# Patient Record
Sex: Male | Born: 1965 | Race: White | Hispanic: No | Marital: Married | Smoking: Never smoker
Health system: Southern US, Community
[De-identification: ages and names within clinical notes are randomized; demographics above are authoritative.]

## PROBLEM LIST (undated history)

## (undated) DIAGNOSIS — G4730 Sleep apnea, unspecified: Secondary | ICD-10-CM

## (undated) DIAGNOSIS — M1990 Unspecified osteoarthritis, unspecified site: Secondary | ICD-10-CM

## (undated) DIAGNOSIS — F329 Major depressive disorder, single episode, unspecified: Secondary | ICD-10-CM

## (undated) DIAGNOSIS — T148XXA Other injury of unspecified body region, initial encounter: Secondary | ICD-10-CM

## (undated) DIAGNOSIS — G4700 Insomnia, unspecified: Secondary | ICD-10-CM

## (undated) DIAGNOSIS — K219 Gastro-esophageal reflux disease without esophagitis: Secondary | ICD-10-CM

## (undated) DIAGNOSIS — F32A Depression, unspecified: Secondary | ICD-10-CM

## (undated) DIAGNOSIS — M109 Gout, unspecified: Secondary | ICD-10-CM

## (undated) DIAGNOSIS — E785 Hyperlipidemia, unspecified: Secondary | ICD-10-CM

## (undated) HISTORY — PX: EXPLORATORY LAPAROTOMY: SUR591

## (undated) HISTORY — PX: WISDOM TOOTH EXTRACTION: SHX21

---

## 2001-06-15 HISTORY — PX: CHOLECYSTECTOMY: SHX55

## 2012-06-15 HISTORY — PX: ROTATOR CUFF REPAIR: SHX139

## 2012-10-04 ENCOUNTER — Encounter (HOSPITAL_COMMUNITY): Payer: Self-pay | Admitting: *Deleted

## 2012-10-04 NOTE — Progress Notes (Signed)
No labs needed

## 2012-10-05 NOTE — H&P (Signed)
  Date of examination:  09-22-12  Indication for surgery: Exotropia preventing binocularity  Pertinent past medical history:  Past Medical History  Diagnosis Date  . Hyperlipemia   . Depression   . Insomnia   . GERD (gastroesophageal reflux disease)     hx-no meds  . Torn muscle     rt calf-wrapped  . Arthritis     knees    Pertinent ocular history:  Hx XT "all my life"; hx patching for amblyopia  Pertinent family history: History reviewed. No pertinent family history.  General:  Healthy appearing patient in no distress.    Eyes:    Acuity cc  [place]  OD 20/20  OS 20/50  External: Within normal limits     Motility LXT = 50, LHT = 4, LXT' = 50, obliques nl  Fundus: Normal     Refraction:   negligible OU   Heart: Regular rate and rhythm without murmur     Lungs: Clear to auscultation     Abdomen: Soft, nontender, normal bowel sounds     Impression:Exotropia, with amblyopia of left eye  Plan: LLR recess and LMR resect  Rushi Chasen O

## 2012-10-07 ENCOUNTER — Encounter (HOSPITAL_BASED_OUTPATIENT_CLINIC_OR_DEPARTMENT_OTHER): Payer: Self-pay | Admitting: *Deleted

## 2012-10-07 ENCOUNTER — Ambulatory Visit (HOSPITAL_BASED_OUTPATIENT_CLINIC_OR_DEPARTMENT_OTHER): Payer: BC Managed Care – PPO | Admitting: *Deleted

## 2012-10-07 ENCOUNTER — Ambulatory Visit (HOSPITAL_BASED_OUTPATIENT_CLINIC_OR_DEPARTMENT_OTHER)
Admission: RE | Admit: 2012-10-07 | Discharge: 2012-10-07 | Disposition: A | Discharge status: Home / Self Care | Payer: BC Managed Care – PPO | Source: Ambulatory Visit | Attending: Ophthalmology | Admitting: Ophthalmology

## 2012-10-07 ENCOUNTER — Encounter (HOSPITAL_BASED_OUTPATIENT_CLINIC_OR_DEPARTMENT_OTHER)
Admission: RE | Disposition: A | Discharge status: Home / Self Care | Payer: Self-pay | Source: Ambulatory Visit | Attending: Ophthalmology

## 2012-10-07 DIAGNOSIS — F3289 Other specified depressive episodes: Secondary | ICD-10-CM | POA: Insufficient documentation

## 2012-10-07 DIAGNOSIS — G4730 Sleep apnea, unspecified: Secondary | ICD-10-CM | POA: Insufficient documentation

## 2012-10-07 DIAGNOSIS — H5010 Unspecified exotropia: Secondary | ICD-10-CM | POA: Insufficient documentation

## 2012-10-07 DIAGNOSIS — H53009 Unspecified amblyopia, unspecified eye: Secondary | ICD-10-CM | POA: Insufficient documentation

## 2012-10-07 DIAGNOSIS — K219 Gastro-esophageal reflux disease without esophagitis: Secondary | ICD-10-CM | POA: Insufficient documentation

## 2012-10-07 DIAGNOSIS — F329 Major depressive disorder, single episode, unspecified: Secondary | ICD-10-CM | POA: Insufficient documentation

## 2012-10-07 DIAGNOSIS — E785 Hyperlipidemia, unspecified: Secondary | ICD-10-CM | POA: Insufficient documentation

## 2012-10-07 HISTORY — DX: Major depressive disorder, single episode, unspecified: F32.9

## 2012-10-07 HISTORY — PX: STRABISMUS SURGERY: SHX218

## 2012-10-07 HISTORY — DX: Other injury of unspecified body region, initial encounter: T14.8XXA

## 2012-10-07 HISTORY — DX: Unspecified osteoarthritis, unspecified site: M19.90

## 2012-10-07 HISTORY — DX: Insomnia, unspecified: G47.00

## 2012-10-07 HISTORY — DX: Gastro-esophageal reflux disease without esophagitis: K21.9

## 2012-10-07 HISTORY — DX: Hyperlipidemia, unspecified: E78.5

## 2012-10-07 HISTORY — DX: Depression, unspecified: F32.A

## 2012-10-07 SURGERY — REPAIR STRABISMUS
Anesthesia: General | Site: Eye | Laterality: Left | Wound class: Clean

## 2012-10-07 MED ORDER — TOBRAMYCIN-DEXAMETHASONE 0.3-0.1 % OP OINT: TOPICAL_OINTMENT | Freq: Two times a day (BID) | OPHTHALMIC | Status: DC

## 2012-10-07 MED ORDER — FENTANYL CITRATE 0.05 MG/ML IJ SOLN: 50.0000 ug | INTRAMUSCULAR | Status: DC | PRN

## 2012-10-07 MED ORDER — MIDAZOLAM HCL 5 MG/5ML IJ SOLN
INTRAMUSCULAR | Status: DC | PRN
  Administered 2012-10-07: 2 mg via INTRAVENOUS

## 2012-10-07 MED ORDER — HYDROMORPHONE HCL PF 1 MG/ML IJ SOLN
0.2500 mg | INTRAMUSCULAR | Status: DC | PRN
  Administered 2012-10-07 (×2): 0.5 mg via INTRAVENOUS

## 2012-10-07 MED ORDER — OXYCODONE HCL 5 MG/5ML PO SOLN
5.0000 mg | Freq: Once | ORAL | Status: AC | PRN
Start: 2012-10-07 — End: 2012-10-07

## 2012-10-07 MED ORDER — PROPOFOL 10 MG/ML IV BOLUS
INTRAVENOUS | Status: DC | PRN
  Administered 2012-10-07: 300 mg via INTRAVENOUS
  Administered 2012-10-07: 100 mg via INTRAVENOUS

## 2012-10-07 MED ORDER — DEXAMETHASONE SODIUM PHOSPHATE 4 MG/ML IJ SOLN
INTRAMUSCULAR | Status: DC | PRN
  Administered 2012-10-07: 10 mg via INTRAVENOUS

## 2012-10-07 MED ORDER — PHENYLEPHRINE HCL 2.5 % OP SOLN
OPHTHALMIC | Status: DC | PRN
  Administered 2012-10-07: 1 [drp] via OPHTHALMIC

## 2012-10-07 MED ORDER — ATROPINE SULFATE 0.4 MG/ML IJ SOLN
INTRAMUSCULAR | Status: DC | PRN
  Administered 2012-10-07: .2 mg via INTRAVENOUS

## 2012-10-07 MED ORDER — BSS IO SOLN
INTRAOCULAR | Status: DC | PRN
  Administered 2012-10-07: 10 mL via INTRAOCULAR

## 2012-10-07 MED ORDER — MIDAZOLAM HCL 2 MG/2ML IJ SOLN: 1.0000 mg | INTRAMUSCULAR | Status: DC | PRN

## 2012-10-07 MED ORDER — KETOROLAC TROMETHAMINE 30 MG/ML IJ SOLN
INTRAMUSCULAR | Status: DC | PRN
  Administered 2012-10-07: 30 mg via INTRAVENOUS

## 2012-10-07 MED ORDER — LIDOCAINE HCL (CARDIAC) 20 MG/ML IV SOLN
INTRAVENOUS | Status: DC | PRN
  Administered 2012-10-07: 60 mg via INTRAVENOUS

## 2012-10-07 MED ORDER — ONDANSETRON HCL 4 MG/2ML IJ SOLN: 4.0000 mg | Freq: Once | INTRAMUSCULAR | Status: DC | PRN

## 2012-10-07 MED ORDER — OXYCODONE HCL 5 MG PO TABS
5.0000 mg | ORAL_TABLET | Freq: Once | ORAL | Status: AC | PRN
  Administered 2012-10-07: 5 mg via ORAL

## 2012-10-07 MED ORDER — FENTANYL CITRATE 0.05 MG/ML IJ SOLN
INTRAMUSCULAR | Status: DC | PRN
  Administered 2012-10-07 (×2): 50 ug via INTRAVENOUS

## 2012-10-07 MED ORDER — LACTATED RINGERS IV SOLN
INTRAVENOUS | Status: DC
  Administered 2012-10-07 (×2): via INTRAVENOUS

## 2012-10-07 MED ORDER — TOBRAMYCIN-DEXAMETHASONE 0.3-0.1 % OP OINT
TOPICAL_OINTMENT | OPHTHALMIC | Status: DC | PRN
  Administered 2012-10-07: 1 via OPHTHALMIC

## 2012-10-07 MED ORDER — ONDANSETRON HCL 4 MG/2ML IJ SOLN
INTRAMUSCULAR | Status: DC | PRN
  Administered 2012-10-07: 4 mg via INTRAVENOUS

## 2012-10-07 SURGICAL SUPPLY — 28 items
APPLICATOR COTTON TIP 6IN STRL (MISCELLANEOUS) ×8 IMPLANT
APPLICATOR DR MATTHEWS STRL (MISCELLANEOUS) ×2 IMPLANT
CAUTERY EYE LOW TEMP 1300F FIN (OPHTHALMIC RELATED) IMPLANT
CLOTH BEACON ORANGE TIMEOUT ST (SAFETY) ×2 IMPLANT
COVER MAYO STAND STRL (DRAPES) ×2 IMPLANT
COVER TABLE BACK 60X90 (DRAPES) ×2 IMPLANT
DRAPE SURG 17X23 STRL (DRAPES) ×4 IMPLANT
DRAPE U-SHAPE 76X120 STRL (DRAPES) ×2 IMPLANT
GLOVE BIO SURGEON STRL SZ 6.5 (GLOVE) ×2 IMPLANT
GLOVE BIOGEL M STRL SZ7.5 (GLOVE) ×4 IMPLANT
GOWN BRE IMP PREV XXLGXLNG (GOWN DISPOSABLE) ×2 IMPLANT
GOWN PREVENTION PLUS XLARGE (GOWN DISPOSABLE) ×2 IMPLANT
NS IRRIG 1000ML POUR BTL (IV SOLUTION) ×2 IMPLANT
PACK BASIN DAY SURGERY FS (CUSTOM PROCEDURE TRAY) ×2 IMPLANT
PAD EYE OVAL STERILE LF (GAUZE/BANDAGES/DRESSINGS) ×4 IMPLANT
SHEET MEDIUM DRAPE 40X70 STRL (DRAPES) IMPLANT
SPEAR EYE SURG WECK-CEL (MISCELLANEOUS) ×4 IMPLANT
STRIP CLOSURE SKIN 1/4X4 (GAUZE/BANDAGES/DRESSINGS) IMPLANT
SUT 6 0 SILK T G140 8DA (SUTURE) IMPLANT
SUT MERSILENE 6 0 S14 DA (SUTURE) IMPLANT
SUT PLAIN 6 0 TG1408 (SUTURE) ×2 IMPLANT
SUT SILK 4 0 C 3 735G (SUTURE) IMPLANT
SUT VICRYL 6 0 S 28 (SUTURE) ×2 IMPLANT
SUT VICRYL ABS 6-0 S29 18IN (SUTURE) ×2 IMPLANT
SYRINGE 10CC LL (SYRINGE) ×2 IMPLANT
TOWEL OR 17X24 6PK STRL BLUE (TOWEL DISPOSABLE) ×2 IMPLANT
TOWEL OR NON WOVEN STRL DISP B (DISPOSABLE) ×2 IMPLANT
TRAY DSU PREP LF (CUSTOM PROCEDURE TRAY) ×2 IMPLANT

## 2012-10-07 NOTE — Anesthesia Preprocedure Evaluation (Signed)
Anesthesia Evaluation  Patient identified by MRN, date of birth, ID band Patient awake    Reviewed: Allergy & Precautions, H&P , NPO status , Patient's Chart, lab work & pertinent test results  Airway Mallampati: I TM Distance: >3 FB Neck ROM: Full    Dental  (+) Teeth Intact and Dental Advisory Given   Pulmonary  breath sounds clear to auscultation        Cardiovascular Rhythm:Regular     Neuro/Psych    GI/Hepatic GERD-  Medicated and Controlled,  Endo/Other    Renal/GU      Musculoskeletal   Abdominal   Peds  Hematology   Anesthesia Other Findings   Reproductive/Obstetrics                           Anesthesia Physical Anesthesia Plan  ASA: I  Anesthesia Plan: General   Post-op Pain Management:    Induction: Intravenous  Airway Management Planned: LMA  Additional Equipment:   Intra-op Plan:   Post-operative Plan: Extubation in OR  Informed Consent: I have reviewed the patients History and Physical, chart, labs and discussed the procedure including the risks, benefits and alternatives for the proposed anesthesia with the patient or authorized representative who has indicated his/her understanding and acceptance.   Dental advisory given  Plan Discussed with: CRNA, Anesthesiologist and Surgeon  Anesthesia Plan Comments:         Anesthesia Quick Evaluation

## 2012-10-07 NOTE — Transfer of Care (Signed)
Immediate Anesthesia Transfer of Care Note  Patient: Aaron Beck  Procedure(s) Performed: Procedure(s): REPAIR STRABISMUS (Left)  Patient Location: PACU  Anesthesia Type:General  Level of Consciousness: awake and responds to stimulation  Airway & Oxygen Therapy: Patient Spontanous Breathing and Patient connected to face mask oxygen  Post-op Assessment: Report given to PACU RN, Post -op Vital signs reviewed and stable and Patient moving all extremities  Post vital signs: Reviewed and stable  Complications: No apparent anesthesia complications

## 2012-10-07 NOTE — Anesthesia Procedure Notes (Addendum)
Procedure Name: LMA Insertion Date/Time: 10/07/2012 10:15 AM Performed by: Meyer Russel Pre-anesthesia Checklist: Patient identified, Emergency Drugs available, Suction available and Patient being monitored Patient Re-evaluated:Patient Re-evaluated prior to inductionOxygen Delivery Method: Circle System Utilized Preoxygenation: Pre-oxygenation with 100% oxygen Intubation Type: IV induction Ventilation: Mask ventilation without difficulty LMA: LMA inserted LMA Size: 5.0 Number of attempts: 2 (unable to advance Flex LMA thru teeth #5 Unique LMA inerted) Airway Equipment and Method: bite block Placement Confirmation: positive ETCO2 and breath sounds checked- equal and bilateral Tube secured with: Tape Dental Injury: Teeth and Oropharynx as per pre-operative assessment    Performed by: Meyer Russel

## 2012-10-07 NOTE — Op Note (Signed)
10/07/2012  11:06 AM  PATIENT:  Aaron Beck    PRE-OPERATIVE DIAGNOSIS:  exotropia  POST-OPERATIVE DIAGNOSIS:  Same  PROCEDURE:  REPAIR STRABISMUS  SURGEON:  Shara Blazing, MD  ANESTHESIA:   General  PREOPERATIVE INDICATIONS:  [name] is a  47 y.o. male with a diagnosis of exotropia who failed conservative measures and elected for surgical management.    The risks benefits and alternatives were discussed with the patient preoperatively including but not limited to the risks of infection, bleeding, nerve injury, cardiopulmonary complications, the need for revision surgery, among others, and the patient was willing to proc  OPERATIVE PROCEDURE: The patient was taken to the operating room where he was identified by me. General anesthesia was induced without difficulty after placement of appropriate monitors. The patient was prepped and draped in standard sterile fashion. A lid speculum was placed in the left eye to  Through an inferotemporal fornix incision through conjunctiva and Tenon fascia, the left lateral rectus muscle was engaged on a series of muscle hooks and cleared of its fascial attachments. The tendon was secured with a double-armed 6-0 Vicryl suture, with a double locking bite at each border of the muscle, 1 mm from the insertion. The muscle was disinserted. It was reattached to sclera at a measured distance of 9.0 mm posterior to the original insertion, using direct scleral passes in crossed swords fashion. The suture ends were tied securely after the position of the muscle had been checked and found to be accurate. Conjunctiva was closed with 2 6-0 Vicryl sutures.  Through an inferonasal fornix incision through conjunctiva and Tenon fascia, the left medial rectus muscle was engaged on a series of muscle hooks and carefully cleared of its fascial attachments to at least 15 mm posterior to the insertion. The muscle was spread between 2 self-retaining hooks. A 2 mm bite was  taken of the center of the muscle belly at a measured distance of 7.0 mm posterior to the insertion, and a knot was tied securely at this location. The needle agent of the double-armed suture was passed from the center of the muscle belly to the periphery, parallel to and 7.0 mm posterior to the insertion. A double locking bite was placed at each border of the muscle. A resection clamp was placed on the muscle just anterior to the sutures. The muscle was disinserted. Each pole suture was passed posteriorly to anteriorly through the corresponding end of the muscle stump, then anteriorly to posteriorly near the center of the stump, then posteriorly to anteriorly through the center of the muscle belly, just posterior to the previously placed knot. The muscle was drawn up to the level of the original insertion, and all slack was removed before the suture ends were tied securely. The clamp was removed. The portion of the muscle anterior to the sutures was carefully excised. Conjunctiva was closed with 2 6-0 Vicryl sutures. The speculum was removed. TobraDex ointment was placed in the left eye. The patient was awakened without difficulty and taken to the recovery room in stable condition, having suffered no intraoperative or immediate postoperative complications.

## 2012-10-07 NOTE — Interval H&P Note (Signed)
History and Physical Interval Note:  10/07/2012 9:53 AM  Aaron Beck  has presented today for surgery, with the diagnosis of exotropia  The various methods of treatment have been discussed with the patient and family. After consideration of risks, benefits and other options for treatment, the patient has consented to  Procedure(s): REPAIR STRABISMUS (Left) as a surgical intervention .  The patient's history has been reviewed, patient examined, no change in status, stable for surgery.  I have reviewed the patient's chart and labs.  Questions were answered to the patient's satisfaction.     Shara Blazing

## 2012-10-07 NOTE — Anesthesia Postprocedure Evaluation (Signed)
  Anesthesia Post-op Note  Patient: Aaron Beck  Procedure(s) Performed: Procedure(s): REPAIR STRABISMUS (Left)  Patient Location: PACU  Anesthesia Type:General  Level of Consciousness: awake, alert  and oriented  Airway and Oxygen Therapy: Patient Spontanous Breathing and Patient connected to face mask oxygen  Post-op Pain: none  Post-op Assessment: Post-op Vital signs reviewed  Post-op Vital Signs: Reviewed  Complications: No apparent anesthesia complications

## 2012-10-07 NOTE — Interval H&P Note (Signed)
History and Physical Interval Note:  10/07/2012 9:53 AM  Aaron Beck  has presented today for surgery, with the diagnosis of exotropia  The various methods of treatment have been discussed with the patient and family. After consideration of risks, benefits and other options for treatment, the patient has consented to  Procedure(s): REPAIR STRABISMUS (Left) as a surgical intervention .  The patient's history has been reviewed, patient examined, no change in status, stable for surgery.  I have reviewed the patient's chart and labs.  Questions were answered to the patient's satisfaction.     [name] O   

## 2012-10-10 ENCOUNTER — Encounter (HOSPITAL_BASED_OUTPATIENT_CLINIC_OR_DEPARTMENT_OTHER): Payer: Self-pay | Admitting: Ophthalmology

## 2015-11-14 ENCOUNTER — Encounter (INDEPENDENT_AMBULATORY_CARE_PROVIDER_SITE_OTHER): Payer: Self-pay | Admitting: *Deleted

## 2015-11-21 ENCOUNTER — Encounter (INDEPENDENT_AMBULATORY_CARE_PROVIDER_SITE_OTHER): Payer: Self-pay | Admitting: *Deleted

## 2015-11-21 ENCOUNTER — Other Ambulatory Visit (INDEPENDENT_AMBULATORY_CARE_PROVIDER_SITE_OTHER): Payer: Self-pay | Admitting: *Deleted

## 2015-11-21 DIAGNOSIS — Z1211 Encounter for screening for malignant neoplasm of colon: Secondary | ICD-10-CM

## 2016-01-10 ENCOUNTER — Telehealth (INDEPENDENT_AMBULATORY_CARE_PROVIDER_SITE_OTHER): Payer: Self-pay | Admitting: *Deleted

## 2016-01-10 ENCOUNTER — Encounter (INDEPENDENT_AMBULATORY_CARE_PROVIDER_SITE_OTHER): Payer: Self-pay | Admitting: *Deleted

## 2016-01-10 NOTE — Telephone Encounter (Signed)
Patient needs trilyte 

## 2016-01-14 MED ORDER — PEG 3350-KCL-NA BICARB-NACL 420 G PO SOLR: 4000.0000 mL | Freq: Once | ORAL | 0 refills | Status: AC

## 2016-02-03 ENCOUNTER — Telehealth (INDEPENDENT_AMBULATORY_CARE_PROVIDER_SITE_OTHER): Payer: Self-pay | Admitting: *Deleted

## 2016-02-03 NOTE — Telephone Encounter (Signed)
Referring MD/PCP: vyas   Procedure: tcs  Reason/Indication:  screening  Has patient had this procedure before?  no  If so, when, by whom and where?    Is there a family history of colon cancer?  no  Who?  What age when diagnosed?    Is patient diabetic?   no      Does patient have prosthetic heart valve or mechanical valve?  no  Do you have a pacemaker?  no  Has patient ever had endocarditis? no  Has patient had joint replacement within last 12 months?  no  Does patient tend to be constipated or take laxatives? no  Does patient have a history of alcohol/drug use?  no  Is patient on Coumadin, Plavix and/or Aspirin? no  Medications: simvastatin 40 mg daily, fenofibrate 54 mg daily, citalopram 15 mg daily, ambien 10 mg nightly  Allergies: nkda  Medication Adjustment:   Procedure date & time: 03/05/16 at 930

## 2016-02-04 NOTE — Telephone Encounter (Signed)
agree

## 2016-05-20 ENCOUNTER — Encounter (INDEPENDENT_AMBULATORY_CARE_PROVIDER_SITE_OTHER): Payer: Self-pay | Admitting: *Deleted

## 2016-05-25 ENCOUNTER — Telehealth (INDEPENDENT_AMBULATORY_CARE_PROVIDER_SITE_OTHER): Payer: Self-pay | Admitting: *Deleted

## 2016-05-25 NOTE — Telephone Encounter (Signed)
Referring MD/PCP: vyas   Procedure: tcs  Reason/Indication:  screening  Has patient had this procedure before?  no  If so, when, by whom and where?    Is there a family history of colon cancer?  no  Who?  What age when diagnosed?    Is patient diabetic?   no      Does patient have prosthetic heart valve or mechanical valve?  no  Do you have a pacemaker?  no  Has patient ever had endocarditis? no  Has patient had joint replacement within last 12 months?  no  Does patient tend to be constipated or take laxatives? no  Does patient have a history of alcohol/drug use?  no  Is patient on Coumadin, Plavix and/or Aspirin? no  Medications: simvastatin 40 mg daily, fenofibrate 54 mg daily, citalopram 15 mg daily, ambien 10 mg nightly  Allergies: nkda  Medication Adjustment:   Procedure date & time: 06/25/16 at 730

## 2016-05-26 NOTE — Telephone Encounter (Signed)
agree

## 2016-06-22 ENCOUNTER — Encounter (INDEPENDENT_AMBULATORY_CARE_PROVIDER_SITE_OTHER): Payer: Self-pay | Admitting: *Deleted

## 2016-08-14 ENCOUNTER — Encounter (INDEPENDENT_AMBULATORY_CARE_PROVIDER_SITE_OTHER): Payer: Self-pay | Admitting: *Deleted

## 2016-08-14 NOTE — Telephone Encounter (Signed)
This encounter was created in error - please disregard.

## 2016-08-27 ENCOUNTER — Telehealth (INDEPENDENT_AMBULATORY_CARE_PROVIDER_SITE_OTHER): Payer: Self-pay | Admitting: *Deleted

## 2016-08-27 NOTE — Telephone Encounter (Signed)
agree

## 2016-08-27 NOTE — Telephone Encounter (Signed)
Referring MD/PCP: vyas   Procedure: tcs  Reason/Indication:  screening  Has patient had this procedure before?  no             If so, when, by whom and where?    Is there a family history of colon cancer?  no             Who?  What age when diagnosed?    Is patient diabetic?   no                                                  Does patient have prosthetic heart valve or mechanical valve?  no  Do you have a pacemaker?  no  Has patient ever had endocarditis? no  Has patient had joint replacement within last 12 months?  no  Does patient tend to be constipated or take laxatives? no  Does patient have a history of alcohol/drug use?  no  Is patient on Coumadin, Plavix and/or Aspirin? no  Medications: simvastatin 40 mg daily, fenofibrate 54 mg daily, citalopram 15 mg daily, ambien 10 mg nightly  Allergies: nkda  Medication Adjustment:   Procedure date & time: 09/23/16 at 930

## 2016-09-23 ENCOUNTER — Encounter (HOSPITAL_COMMUNITY): Payer: Self-pay | Admitting: *Deleted

## 2016-09-23 ENCOUNTER — Encounter (HOSPITAL_COMMUNITY)
Admission: RE | Disposition: A | Discharge status: Home / Self Care | Payer: Self-pay | Source: Ambulatory Visit | Attending: Internal Medicine

## 2016-09-23 ENCOUNTER — Ambulatory Visit (HOSPITAL_COMMUNITY)
Admission: RE | Admit: 2016-09-23 | Discharge: 2016-09-23 | Disposition: A | Discharge status: Home / Self Care | Payer: BC Managed Care – PPO | Source: Ambulatory Visit | Attending: Internal Medicine | Admitting: Internal Medicine

## 2016-09-23 DIAGNOSIS — Z79899 Other long term (current) drug therapy: Secondary | ICD-10-CM | POA: Diagnosis not present

## 2016-09-23 DIAGNOSIS — K648 Other hemorrhoids: Secondary | ICD-10-CM

## 2016-09-23 DIAGNOSIS — Z9049 Acquired absence of other specified parts of digestive tract: Secondary | ICD-10-CM | POA: Diagnosis not present

## 2016-09-23 DIAGNOSIS — Z1211 Encounter for screening for malignant neoplasm of colon: Secondary | ICD-10-CM | POA: Diagnosis not present

## 2016-09-23 DIAGNOSIS — G4700 Insomnia, unspecified: Secondary | ICD-10-CM | POA: Diagnosis not present

## 2016-09-23 DIAGNOSIS — F329 Major depressive disorder, single episode, unspecified: Secondary | ICD-10-CM | POA: Insufficient documentation

## 2016-09-23 DIAGNOSIS — E785 Hyperlipidemia, unspecified: Secondary | ICD-10-CM | POA: Diagnosis not present

## 2016-09-23 DIAGNOSIS — G4730 Sleep apnea, unspecified: Secondary | ICD-10-CM | POA: Insufficient documentation

## 2016-09-23 HISTORY — DX: Sleep apnea, unspecified: G47.30

## 2016-09-23 HISTORY — PX: COLONOSCOPY: SHX5424

## 2016-09-23 SURGERY — COLONOSCOPY
Anesthesia: Moderate Sedation

## 2016-09-23 MED ORDER — MIDAZOLAM HCL 5 MG/5ML IJ SOLN
INTRAMUSCULAR | Status: DC
Start: 2016-09-23 — End: 2016-09-23
  Filled 2016-09-23: qty 10

## 2016-09-23 MED ORDER — STERILE WATER FOR IRRIGATION IR SOLN
Status: DC | PRN
  Administered 2016-09-23: 2.5 mL

## 2016-09-23 MED ORDER — SODIUM CHLORIDE 0.9 % IV SOLN
INTRAVENOUS | Status: DC
  Administered 2016-09-23: 1000 mL via INTRAVENOUS

## 2016-09-23 MED ORDER — MEPERIDINE HCL 50 MG/ML IJ SOLN
INTRAMUSCULAR | Status: AC
  Filled 2016-09-23: qty 1

## 2016-09-23 MED ORDER — MEPERIDINE HCL 50 MG/ML IJ SOLN
INTRAMUSCULAR | Status: DC | PRN
  Administered 2016-09-23 (×2): 25 mg via INTRAVENOUS

## 2016-09-23 MED ORDER — MIDAZOLAM HCL 5 MG/5ML IJ SOLN
INTRAMUSCULAR | Status: DC | PRN
  Administered 2016-09-23: 3 mg via INTRAVENOUS
  Administered 2016-09-23: 2 mg via INTRAVENOUS
  Administered 2016-09-23: 3 mg via INTRAVENOUS
  Administered 2016-09-23: 2 mg via INTRAVENOUS

## 2016-09-23 NOTE — Op Note (Signed)
[facility] Patient Name: Aaron Beck Procedure Date: 09/23/2016 9:39 AM MRN: [id number] Date of Birth: 05/14/1966 Attending MD: Lionel December , MD CSN: [id number] Age: 51 Admit Type: Outpatient Procedure:                Colonoscopy Indications:              Screening for colorectal malignant neoplasm Providers:                Lionel December, MD, Toniann Fail RN, RN, Laurena Bering, Technologist Referring MD:             Ignatius Specking, MD Medicines:                Meperidine 50 mg IV, Midazolam 10 mg IV Complications:            No immediate complications. Estimated Blood Loss:     Estimated blood loss: none. Procedure:                Pre-Anesthesia Assessment:                           - Prior to the procedure, a History and Physical                            was performed, and patient medications and                            allergies were reviewed. The patient's tolerance of                            previous anesthesia was also reviewed. The risks                            and benefits of the procedure and the sedation                            options and risks were discussed with the patient.                            All questions were answered, and informed consent                            was obtained. Prior Anticoagulants: The patient has                            taken no previous anticoagulant or antiplatelet                            agents. ASA Grade Assessment: II - A patient with                            mild systemic disease. After reviewing the risks  and benefits, the patient was deemed in                            satisfactory condition to undergo the procedure.                           After obtaining informed consent, the colonoscope                            was passed under direct vision. Throughout the                            procedure, the patient's blood pressure, pulse, and                         oxygen saturations were monitored continuously. The                            EC-[id number] ([id number]) scope was introduced through                            the anus and advanced to the the cecum, identified                            by appendiceal orifice and ileocecal valve. The                            colonoscopy was performed without difficulty. The                            patient tolerated the procedure well. The quality                            of the bowel preparation was excellent. The                            ileocecal valve, appendiceal orifice, and rectum                            were photographed. Scope In: 10:08:23 AM Scope Out: 10:28:10 AM Scope Withdrawal Time: 0 hours 8 minutes 39 seconds  Total Procedure Duration: 0 hours 19 minutes 47 seconds  Findings:      The perianal and digital rectal examinations were normal.      The colon (entire examined portion) appeared normal.      Internal hemorrhoids were found during retroflexion. The hemorrhoids       were small. Impression:               - The entire examined colon is normal.                           - Internal hemorrhoids.                           - No specimens collected. Moderate Sedation:      Moderate (  conscious) sedation was administered by the endoscopy nurse       and supervised by the endoscopist. The following parameters were       monitored: oxygen saturation, heart rate, blood pressure, CO2       capnography and response to care. Total physician intraservice time was       26 minutes. Recommendation:           - Patient has a contact number available for                            emergencies. The signs and symptoms of potential                            delayed complications were discussed with the                            patient. Return to normal activities tomorrow.                            Written discharge instructions were provided to the                             patient.                           - Resume previous diet today.                           - Continue present medications.                           - Repeat colonoscopy in 10 years for screening                            purposes. Procedure Code(s):        --- Professional ---                           [phone], Colonoscopy, flexible; diagnostic, including                            collection of specimen(s) by brushing or washing,                            when performed (separate procedure)                           99152, Moderate sedation services provided by the                            same physician or other qualified health care                            professional performing the diagnostic or  therapeutic service that the sedation supports,                            requiring the presence of an independent trained                            observer to assist in the monitoring of the                            patient's level of consciousness and physiological                            status; initial 15 minutes of intraservice time,                            patient age 19 years or older                           [phone], Moderate sedation services; each additional                            15 minutes intraservice time Diagnosis Code(s):        --- Professional ---                           Z12.11, Encounter for screening for malignant                            neoplasm of colon                           K64.8, Other hemorrhoids CPT copyright 2016 [employer]. All rights reserved. The codes documented in this report are preliminary and upon coder review may  be revised to meet current compliance requirements. Lionel December, MD Lionel December, MD 09/23/2016 10:37:04 AM This report has been signed electronically. Number of Addenda: 0

## 2016-09-23 NOTE — Discharge Instructions (Signed)
Resume usual medications and diet. No driving for 24 hours. Next screening exam in 10 years.    Colonoscopy, Adult, Care After This sheet gives you information about how to care for yourself after your procedure. Your doctor may also give you more specific instructions. If you have problems or questions, call your doctor. Follow these instructions at home: General instructions    For the first 24 hours after the procedure:  Do not drive or use machinery.  Do not sign important documents.  Do not drink alcohol.  Do your daily activities more slowly than normal.  Eat foods that are soft and easy to digest.  Rest often.  Take over-the-counter or prescription medicines only as told by your doctor.  It is up to you to get the results of your procedure. Ask your doctor, or the department performing the procedure, when your results will be ready. To help cramping and bloating:   Try walking around.  Put heat on your belly (abdomen) as told by your doctor. Use a heat source that your doctor recommends, such as a moist heat pack or a heating pad.  Put a towel between your skin and the heat source.  Leave the heat on for 20-30 minutes.  Remove the heat if your skin turns bright red. This is especially important if you cannot feel pain, heat, or cold. You can get burned. Eating and drinking   Drink enough fluid to keep your pee (urine) clear or pale yellow.  Return to your normal diet as told by your doctor. Avoid heavy or fried foods that are hard to digest.  Avoid drinking alcohol for as long as told by your doctor. Contact a doctor if:  You have blood in your poop (stool) 2-3 days after the procedure. Get help right away if:  You have more than a small amount of blood in your poop.  You see large clumps of tissue (blood clots) in your poop.  Your belly is swollen.  You feel sick to your stomach (nauseous).  You throw up (vomit).  You have a fever.  You have  belly pain that gets worse, and medicine does not help your pain.    Fat and Cholesterol Restricted Diet Getting too much fat and cholesterol in your diet may cause health problems. Following this diet helps keep your fat and cholesterol at normal levels. This can keep you from getting sick. What types of fat should I choose?  Choose monosaturated and polyunsaturated fats. These are found in foods such as olive oil, canola oil, flaxseeds, walnuts, almonds, and seeds.  Eat more omega-3 fats. Good choices include salmon, mackerel, sardines, tuna, flaxseed oil, and ground flaxseeds.  Limit saturated fats. These are in animal products such as meats, butter, and cream. They can also be in plant products such as palm oil, palm kernel oil, and coconut oil.  Avoid foods with partially hydrogenated oils in them. These contain trans fats. Examples of foods that have trans fats are stick margarine, some tub margarines, cookies, crackers, and other baked goods. What general guidelines do I need to follow?  Check food labels. Look for the words "trans fat" and "saturated fat."  When preparing a meal:  Fill half of your plate with vegetables and green salads.  Fill one fourth of your plate with whole grains. Look for the word "whole" as the first word in the ingredient list.  Fill one fourth of your plate with lean protein foods.  Eat more foods that  have fiber, like apples, carrots, beans, peas, and barley.  Eat more home-cooked foods. Eat less at restaurants and buffets.  Limit or avoid alcohol.  Limit foods high in starch and sugar.  Limit fried foods.  Cook foods without frying them. Baking, boiling, grilling, and broiling are all great options.  Lose weight if you are overweight. Losing even a small amount of weight can help your overall health. It can also help prevent diseases such as diabetes and heart disease. What foods can I eat? Grains  Whole grains, such as whole wheat or  whole grain breads, crackers, cereals, and pasta. Unsweetened oatmeal, bulgur, barley, quinoa, or brown rice. Corn or whole wheat flour tortillas. Vegetables  Fresh or frozen vegetables (raw, steamed, roasted, or grilled). Green salads. Fruits  All fresh, canned (in natural juice), or frozen fruits. Meat and Other Protein Products  Ground beef (85% or leaner), grass-fed beef, or beef trimmed of fat. Skinless chicken or [place]. Ground chicken or [place]. Pork trimmed of fat. All fish and seafood. Eggs. Dried beans, peas, or lentils. Unsalted nuts or seeds. Unsalted canned or dry beans. Dairy  Low-fat dairy products, such as skim or 1% milk, 2% or reduced-fat cheeses, low-fat ricotta or cottage cheese, or plain low-fat yogurt. Fats and Oils  Tub margarines without trans fats. Light or reduced-fat mayonnaise and salad dressings. Avocado. Olive, canola, sesame, or safflower oils. Natural peanut or almond butter (choose ones without added sugar and oil). The items listed above may not be a complete list of recommended foods or beverages. Contact your dietitian for more options.  What foods are not recommended? Grains  White bread. White pasta. White rice. Cornbread. Bagels, pastries, and croissants. Crackers that contain trans fat. Vegetables  White potatoes. Corn. Creamed or fried vegetables. Vegetables in a cheese sauce. Fruits  Dried fruits. Canned fruit in light or heavy syrup. Fruit juice. Meat and Other Protein Products  Fatty cuts of meat. Ribs, chicken wings, bacon, sausage, bologna, salami, chitterlings, fatback, hot dogs, bratwurst, and packaged luncheon meats. Liver and organ meats. Dairy  Whole or 2% milk, cream, half-and-half, and cream cheese. Whole milk cheeses. Whole-fat or sweetened yogurt. Full-fat cheeses. Nondairy creamers and whipped toppings. Processed cheese, cheese spreads, or cheese curds. Sweets and Desserts  Corn syrup, sugars, honey, and molasses. Candy. Jam and jelly.  Syrup. Sweetened cereals. Cookies, pies, cakes, donuts, muffins, and ice cream. Fats and Oils  Butter, stick margarine, lard, shortening, ghee, or bacon fat. Coconut, palm kernel, or palm oils. Beverages  Alcohol. Sweetened drinks (such as sodas, lemonade, and fruit drinks or punches). The items listed above may not be a complete list of foods and beverages to avoid. Contact your dietitian for more information.  This information is not intended to replace advice given to you by your health care provider. Make sure you discuss any questions you have with your health care provider. Document Released: 12/01/2011 Document Revised: 02/06/2016 Document Reviewed: 08/31/2013 Elsevier Interactive Patient Education  2017 [employer].

## 2016-09-23 NOTE — H&P (Signed)
Aaron Beck is an 51 y.o. male.   Chief Complaint: Patient is here for colonoscopy. HPI: Patient is 51 year old Caucasian male who is here for screening colonoscopy. He denies abdominal pain change in bowel habits or rectal bleeding. Family History is negative for CRC.  Past Medical History:  Diagnosis Date  . Arthritis    knees  . Depression   . GERD (gastroesophageal reflux disease)    hx-no meds  . Hyperlipemia   . Insomnia   . Sleep apnea    uses cpap  . Torn muscle    rt calf-wrapped    Past Surgical History:  Procedure Laterality Date  . CHOLECYSTECTOMY  2003  . EXPLORATORY LAPAROTOMY     at 10 weeks intestinal fluid  . ROTATOR CUFF REPAIR Left 2014   bone spurs, torn labrum, and torn bicep  . STRABISMUS SURGERY Left 10/07/2012   Procedure: REPAIR STRABISMUS;  Surgeon: Shara Blazing, MD;  Location: [facility];  Service: Ophthalmology;  Laterality: Left;  . WISDOM TOOTH EXTRACTION      Family History  Problem Relation Age of Onset  . Bladder Cancer Father   . Fibromyalgia Sister    Social History:  reports that he has never smoked. He has never used smokeless tobacco. He reports that he does not drink alcohol or use drugs.  Allergies: No Known Allergies  Medications Prior to Admission  Medication Sig Dispense Refill  . citalopram (CELEXA) 20 MG tablet Take 10-20 mg by mouth 2 (two) times daily. 20 mg in the morning, 10 mg in the afternoon    . colchicine 0.6 MG tablet Take 0.6 mg by mouth daily.    . fenofibrate 54 MG tablet Take 54 mg by mouth daily.    . simvastatin (ZOCOR) 40 MG tablet Take 40 mg by mouth every morning.    . zolpidem (AMBIEN) 10 MG tablet Take 10 mg by mouth at bedtime.      No results found for this or any previous visit (from the past 48 hour(s)). No results found.  ROS  Blood pressure 129/83, pulse 77, temperature 98.2 F (36.8 C), temperature source Oral, resp. rate 16, height  (1.803 m), weight 234 lb (106.1  kg), SpO2 97 %. Physical Exam  Constitutional: He appears well-developed and well-nourished.  HENT:  Mouth/Throat: Oropharynx is clear and moist.  Eyes: Conjunctivae are normal. No scleral icterus.  Neck: No thyromegaly present.  Cardiovascular: Normal rate, regular rhythm and normal heart sounds.   No murmur heard. Respiratory: Effort normal and breath sounds normal.  GI: Soft. He exhibits no distension and no mass. There is no tenderness.  Musculoskeletal: He exhibits no edema.  Lymphadenopathy:    He has no cervical adenopathy.  Neurological: He is alert.  Skin: Skin is warm and dry.     Assessment/Plan Average risk screening colonoscopy.  Lionel December, MD 09/23/2016, 9:59 AM

## 2016-09-25 ENCOUNTER — Encounter (HOSPITAL_COMMUNITY): Payer: Self-pay | Admitting: Internal Medicine

## 2019-01-21 ENCOUNTER — Emergency Department (HOSPITAL_COMMUNITY): Payer: BC Managed Care – PPO

## 2019-01-21 ENCOUNTER — Other Ambulatory Visit: Payer: Self-pay

## 2019-01-21 ENCOUNTER — Emergency Department (HOSPITAL_COMMUNITY)
Admission: EM | Admit: 2019-01-21 | Discharge: 2019-01-22 | Disposition: A | Discharge status: Home / Self Care | Payer: BC Managed Care – PPO | Attending: Emergency Medicine | Admitting: Emergency Medicine

## 2019-01-21 ENCOUNTER — Encounter (HOSPITAL_COMMUNITY): Payer: Self-pay

## 2019-01-21 DIAGNOSIS — Y92009 Unspecified place in unspecified non-institutional (private) residence as the place of occurrence of the external cause: Secondary | ICD-10-CM | POA: Insufficient documentation

## 2019-01-21 DIAGNOSIS — Y9389 Activity, other specified: Secondary | ICD-10-CM | POA: Insufficient documentation

## 2019-01-21 DIAGNOSIS — X500XXA Overexertion from strenuous movement or load, initial encounter: Secondary | ICD-10-CM | POA: Diagnosis not present

## 2019-01-21 DIAGNOSIS — S20221A Contusion of right back wall of thorax, initial encounter: Secondary | ICD-10-CM | POA: Diagnosis not present

## 2019-01-21 DIAGNOSIS — M25511 Pain in right shoulder: Secondary | ICD-10-CM | POA: Insufficient documentation

## 2019-01-21 DIAGNOSIS — T148XXA Other injury of unspecified body region, initial encounter: Secondary | ICD-10-CM

## 2019-01-21 DIAGNOSIS — Z79899 Other long term (current) drug therapy: Secondary | ICD-10-CM | POA: Diagnosis not present

## 2019-01-21 DIAGNOSIS — Y999 Unspecified external cause status: Secondary | ICD-10-CM | POA: Diagnosis not present

## 2019-01-21 DIAGNOSIS — W19XXXA Unspecified fall, initial encounter: Secondary | ICD-10-CM

## 2019-01-21 HISTORY — DX: Gout, unspecified: M10.9

## 2019-01-21 MED ORDER — HYDROCODONE-ACETAMINOPHEN 5-325 MG PO TABS: 2.0000 | ORAL_TABLET | ORAL | 0 refills | Status: AC | PRN

## 2019-01-21 MED ORDER — IBUPROFEN 600 MG PO TABS: 600.0000 mg | ORAL_TABLET | Freq: Four times a day (QID) | ORAL | 0 refills | Status: AC | PRN

## 2019-01-21 MED ORDER — BACITRACIN-NEOMYCIN-POLYMYXIN 400-5-5000 EX OINT
TOPICAL_OINTMENT | Freq: Once | CUTANEOUS | Status: AC
  Administered 2019-01-21: 1 via TOPICAL
  Filled 2019-01-21: qty 1

## 2019-01-21 NOTE — ED Notes (Signed)
ED Provider at bedside. 

## 2019-01-21 NOTE — ED Triage Notes (Signed)
Pt presents to ED following fall down approx 12-15 steps carrying dresser, states dresser fell on him. Pt denies LOC or hitting head. Pt states he landed on right side. Pt c/o right shoulder pain, left elbow, left leg. Pt also with laceration to left leg, bleeding controlled at this time.

## 2019-01-21 NOTE — Discharge Instructions (Addendum)
Your xrays are negative for acute fractures as discussed.  Continue using ice as much as is comfortable for the next 2 days.  You may switch to a heating pad on day #3 applied for 20 minutes several times daily which can help speed injury recovery.  I recommend ibuprofen for pain and inflammation.  Wear the sling for comfort and to take the weight off of your shoulder joint.  You may need further test to assess for soft tissue injury such as ligamentous or tendon injury.  Call Dr. Archie Endo office for further evaluation if your symptoms persist or not improving over the next 7 to 10 days with this treatment plan.

## 2019-01-22 NOTE — ED Provider Notes (Signed)
[facility] EMERGENCY DEPARTMENT Provider Note   CSN: [id number] Arrival date & time: 01/21/19  1813     History   Chief Complaint Chief Complaint  Patient presents with  . Fall    HPI Aaron Beck is a 53 y.o. malewith a noncontributory past medical hx presenting for evaluation of right shoulder pain incurred prior to arrival after falling down a flight of steps. He and his wife were attempting to move a dresser downstairs, he was on the "bottom" end when they lost control, he fell backwards landing on the hardwood landing and the dresser fell against his shoulder.  He reports persistent pain across the right upper shoulder and upper back. There is no radiation of pain into his left arm, denies weakness or numbness in the arm and denies head injury or loc.  He has a deep avulsion injury to his left shin, denies pain with weight bearing.  Tetanus current. No tx prior to arrival.      The history is provided by the patient.    Past Medical History:  Diagnosis Date  . Arthritis    knees  . Depression   . GERD (gastroesophageal reflux disease)    hx-no meds  . Gout   . Hyperlipemia   . Insomnia   . Sleep apnea    uses cpap  . Torn muscle    rt calf-wrapped    There are no active problems to display for this patient.   Past Surgical History:  Procedure Laterality Date  . CHOLECYSTECTOMY  2003  . COLONOSCOPY N/A 09/23/2016   Procedure: COLONOSCOPY;  Surgeon: Rogene Houston, MD;  Location: [facility];  Service: Endoscopy;  Laterality: N/A;  930 - moved to 1/11 @ 7:30  . EXPLORATORY LAPAROTOMY     at 10 weeks intestinal fluid  . ROTATOR CUFF REPAIR Left 2014   bone spurs, torn labrum, and torn bicep  . STRABISMUS SURGERY Left 10/07/2012   Procedure: REPAIR STRABISMUS;  Surgeon: Derry Skill, MD;  Location: [facility];  Service: Ophthalmology;  Laterality: Left;  . WISDOM TOOTH EXTRACTION          Home Medications    Prior to Admission  medications   Medication Sig Start Date End Date Taking? Authorizing Provider  metoprolol succinate (TOPROL-XL) 50 MG 24 hr tablet Take 1 tablet by mouth daily. 01/06/19  Yes [provider]  citalopram (CELEXA) 20 MG tablet Take 10-20 mg by mouth 2 (two) times daily. 20 mg in the morning, 10 mg in the afternoon    [provider]  colchicine 0.6 MG tablet Take 0.6 mg by mouth daily.    [provider]  fenofibrate 54 MG tablet Take 54 mg by mouth daily.    [provider]  HYDROcodone-acetaminophen (NORCO/VICODIN) 5-325 MG tablet Take 2 tablets by mouth every 4 (four) hours as needed. 01/21/19   Evalee Jefferson, PA-C  ibuprofen (ADVIL) 600 MG tablet Take 1 tablet (600 mg total) by mouth every 6 (six) hours as needed. 01/21/19   Evalee Jefferson, PA-C  simvastatin (ZOCOR) 40 MG tablet Take 40 mg by mouth every morning.    [provider]  zolpidem (AMBIEN) 10 MG tablet Take 10 mg by mouth at bedtime.    [provider]    Family History Family History  Problem Relation Age of Onset  . Bladder Cancer Father   . Fibromyalgia Sister     Social History Social History   Tobacco Use  .  Smoking status: Never Smoker  . Smokeless tobacco: Never Used  Substance Use Topics  . Alcohol use: Yes    Comment: very rarely  . Drug use: No     Allergies   Patient has no known allergies.   Review of Systems Review of Systems  Constitutional: Negative.  Negative for fever.  Respiratory: Negative.  Negative for shortness of breath.   Cardiovascular: Negative.   Gastrointestinal: Negative for nausea and vomiting.  Musculoskeletal: Positive for arthralgias. Negative for joint swelling and myalgias.  Skin: Positive for wound.  Neurological: Negative for syncope, weakness, numbness and headaches.     Physical Exam Updated Vital Signs BP 129/79 (BP Location: Right Arm)   Pulse 70   Temp 98.2 F (36.8 C) (Oral)   Resp 17   Ht 5\' 11"  (1.803 m)   Wt  104.3 kg   SpO2 99%   BMI 32.08 kg/m   Physical Exam Constitutional:      Appearance: He is well-developed.  HENT:     Head: Normocephalic and atraumatic.     Right Ear: Tympanic membrane normal.     Left Ear: Tympanic membrane normal.  Eyes:     Extraocular Movements: Extraocular movements intact.     Pupils: Pupils are equal, round, and reactive to light.  Neck:     Musculoskeletal: Normal range of motion. Muscular tenderness present. No neck rigidity or spinous process tenderness.      Comments: Mild ttp right lateral neck and upper shoulder musculature.  No posterior midline c spine tenderness.   Cardiovascular:     Rate and Rhythm: Normal rate and regular rhythm.     Comments: Pulses equal bilaterally Pulmonary:     Effort: Pulmonary effort is normal.     Breath sounds: Normal breath sounds. No decreased air movement.       Comments: Faint contusion noted right upper back/scapular region. No edema or palpable deformity Chest:     Chest wall: No swelling.  Musculoskeletal:        General: Tenderness present.     Right shoulder: He exhibits tenderness. He exhibits normal range of motion, no bony tenderness, no swelling, no crepitus, no deformity, no spasm and normal pulse.     Comments: Pain right shoulder joint with active abduction, passive ROM minimally tender with FROM flex/ext and abduction appreciated passively.   Skin:    General: Skin is warm and dry.     Findings: Abrasion present.     Comments: 2 cm scabbing abrasion left mid shin.   Neurological:     Mental Status: He is alert.     Sensory: No sensory deficit.     Deep Tendon Reflexes: Reflexes normal.      ED Treatments / Results  Labs (all labs ordered are listed, but only abnormal results are displayed) Labs Reviewed - No data to display  EKG None  Radiology Dg Ribs Unilateral W/chest Right  Result Date: 01/21/2019 CLINICAL DATA:  53 year old male with fall and right chest wall pain. EXAM:  RIGHT SCAPULA - 2+ VIEWS; RIGHT RIBS AND CHEST - 3+ VIEW COMPARISON:  Right shoulder radiograph dated 01/21/2019 FINDINGS: The lungs are clear. There is no pleural effusion or pneumothorax. The cardiac silhouette is within normal limits. No acute osseous pathology. No displaced rib or scapular fracture. IMPRESSION: Negative. Electronically Signed   By: Elgie CollardArash  Radparvar M.D.   On: 01/21/2019 23:27   Dg Scapula Right  Result Date: 01/21/2019 CLINICAL DATA:  53 year old male with fall  and right chest wall pain. EXAM: RIGHT SCAPULA - 2+ VIEWS; RIGHT RIBS AND CHEST - 3+ VIEW COMPARISON:  Right shoulder radiograph dated 01/21/2019 FINDINGS: The lungs are clear. There is no pleural effusion or pneumothorax. The cardiac silhouette is within normal limits. No acute osseous pathology. No displaced rib or scapular fracture. IMPRESSION: Negative. Electronically Signed   By: Elgie CollardArash  Radparvar M.D.   On: 01/21/2019 23:27   Dg Shoulder Right  Result Date: 01/21/2019 CLINICAL DATA:  Pain status post fall EXAM: RIGHT SHOULDER - 2+ VIEW COMPARISON:  None. FINDINGS: There is no acute displaced fracture or dislocation. There may be some mild anterior subluxation of the humeral head, however this appearance may be in part projectional. IMPRESSION: No acute displaced fracture or dislocation. Electronically Signed   By: Katherine Mantlehristopher  Green M.D.   On: 01/21/2019 19:19    Procedures Procedures (including critical care time)  Medications Ordered in ED Medications  neomycin-bacitracin-polymyxin (NEOSPORIN) ointment packet (1 application Topical Given 01/21/19 2249)     Initial Impression / Assessment and Plan / ED Course  I have reviewed the triage vital signs and the nursing notes.  Pertinent labs & imaging results that were available during my care of the patient were reviewed by me and considered in my medical decision making (see chart for details).        Pt with significant fall and injury to the right shoulder  with no fractures. He has FROM passively without pain, unlikely subluxation.  Discussed RICE, sling provided for comfort. Pt has history of left rotator surgery 4 years ago with Dr. Thurston HoleWainer, referral prn back to him if sx are not improving with todays tx plan.  Discussed he may need MRI if sx not improved to assess for soft tissue injuries.  Tetanus current per pt. Wound care to abrasion provided.  Final Clinical Impressions(s) / ED Diagnoses   Final diagnoses:  Fall, initial encounter  Acute pain of right shoulder  Back contusion, right, initial encounter  Abrasion    ED Discharge Orders         Ordered    ibuprofen (ADVIL) 600 MG tablet  Every 6 hours PRN     01/21/19 2341    HYDROcodone-acetaminophen (NORCO/VICODIN) 5-325 MG tablet  Every 4 hours PRN     01/21/19 2348           Burgess Amordol, Denecia Brunette, PA-C 01/22/19 1130    Donnetta Hutchingook, Brian, MD 01/22/19 2044

## 2019-01-23 MED FILL — Hydrocodone-Acetaminophen Tab 5-325 MG: ORAL | Qty: 6 | Status: AC

## 2019-08-20 ENCOUNTER — Ambulatory Visit: Payer: BC Managed Care – PPO | Attending: Internal Medicine

## 2019-08-20 DIAGNOSIS — Z23 Encounter for immunization: Secondary | ICD-10-CM | POA: Insufficient documentation

## 2019-08-20 NOTE — Progress Notes (Signed)
   Covid-19 Vaccination Clinic  Name:  Destry Dauber    MRN: [id number] DOB: 1965/09/19  08/20/2019  Mr. Whitener was observed post Covid-19 immunization for 15 minutes without incident. He was provided with Vaccine Information Sheet and instruction to access the V-Safe system.   Mr. Favorite was instructed to call 911 with any severe reactions post vaccine: Marland Kitchen Difficulty breathing  . Swelling of face and throat  . A fast heartbeat  . A bad rash all over body  . Dizziness and weakness   Immunizations Administered    Name Date Dose VIS Date Route   Pfizer COVID-19 Vaccine 08/20/2019 11:43 AM 0.3 mL 05/26/2019 Intramuscular   Manufacturer: [employer], [employer]   Lot: [id number]   NDC: [address]-2

## 2019-09-10 ENCOUNTER — Ambulatory Visit: Payer: BC Managed Care – PPO

## 2019-11-20 ENCOUNTER — Other Ambulatory Visit: Payer: Self-pay | Admitting: Physician Assistant

## 2019-11-20 DIAGNOSIS — H903 Sensorineural hearing loss, bilateral: Secondary | ICD-10-CM

## 2019-11-20 DIAGNOSIS — H9312 Tinnitus, left ear: Secondary | ICD-10-CM

## 2019-12-22 ENCOUNTER — Other Ambulatory Visit: Payer: BC Managed Care – PPO

## 2020-01-03 ENCOUNTER — Other Ambulatory Visit: Payer: BC Managed Care – PPO

## 2020-01-05 ENCOUNTER — Other Ambulatory Visit: Payer: BC Managed Care – PPO

## 2020-11-10 IMAGING — DX RIGHT RIBS AND CHEST - 3+ VIEW
5 series · 5 of 5 positions shown · non-contrast
Comparison: Right shoulder radiograph dated 01/21/2019

CLINICAL DATA: 53-year-old male with fall and right chest wall
pain.

EXAM:
RIGHT SCAPULA - 2+ VIEWS; RIGHT RIBS AND CHEST - 3+ VIEW

[chest pa]
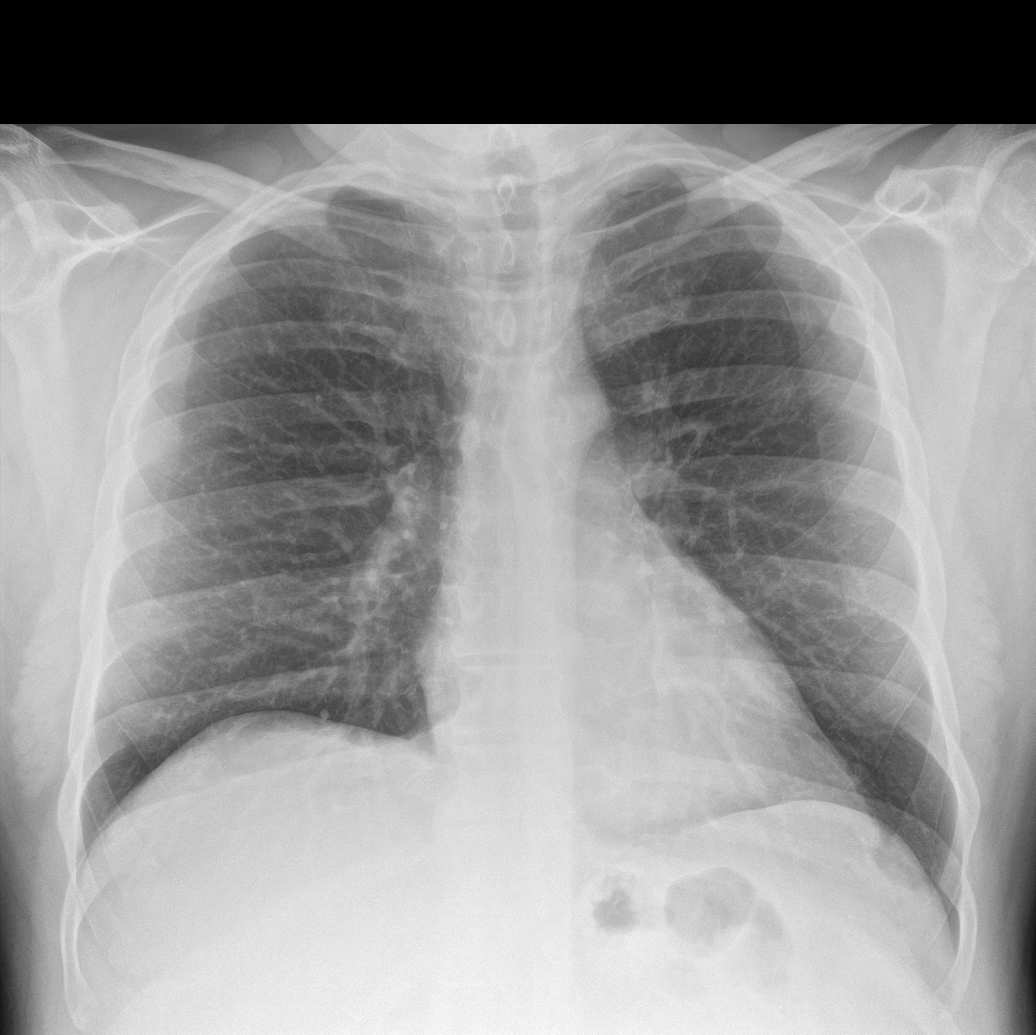

[rib pa (1 of 2)]
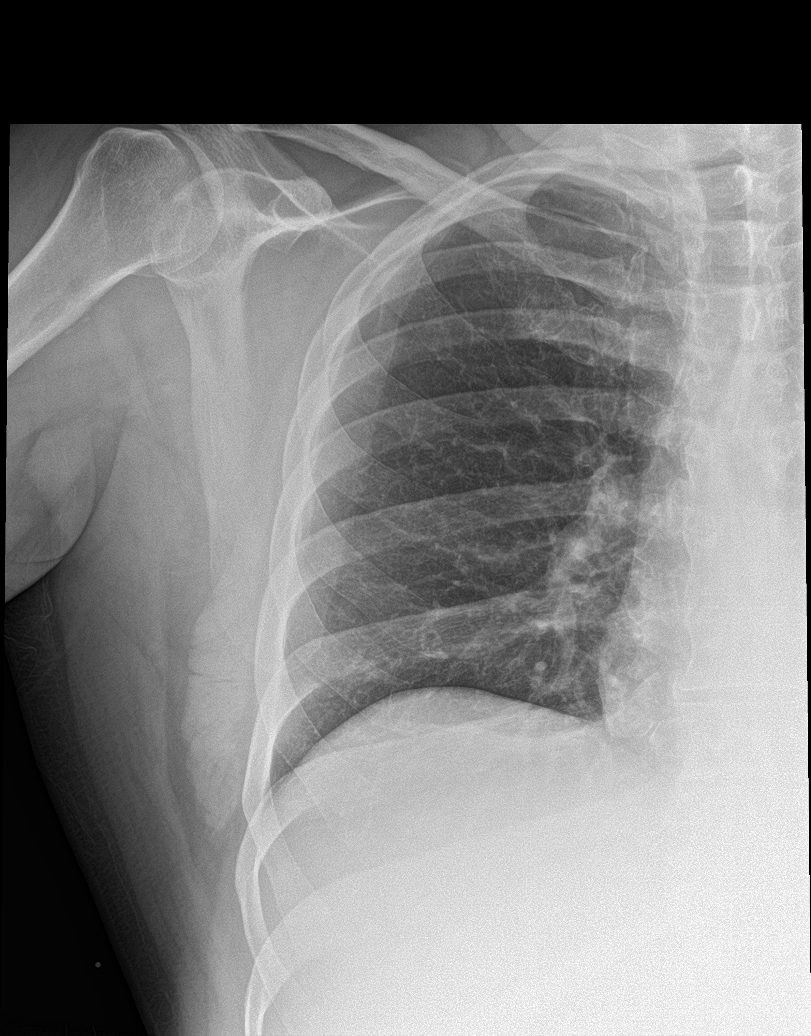

[rib pa obl (1 of 2)]
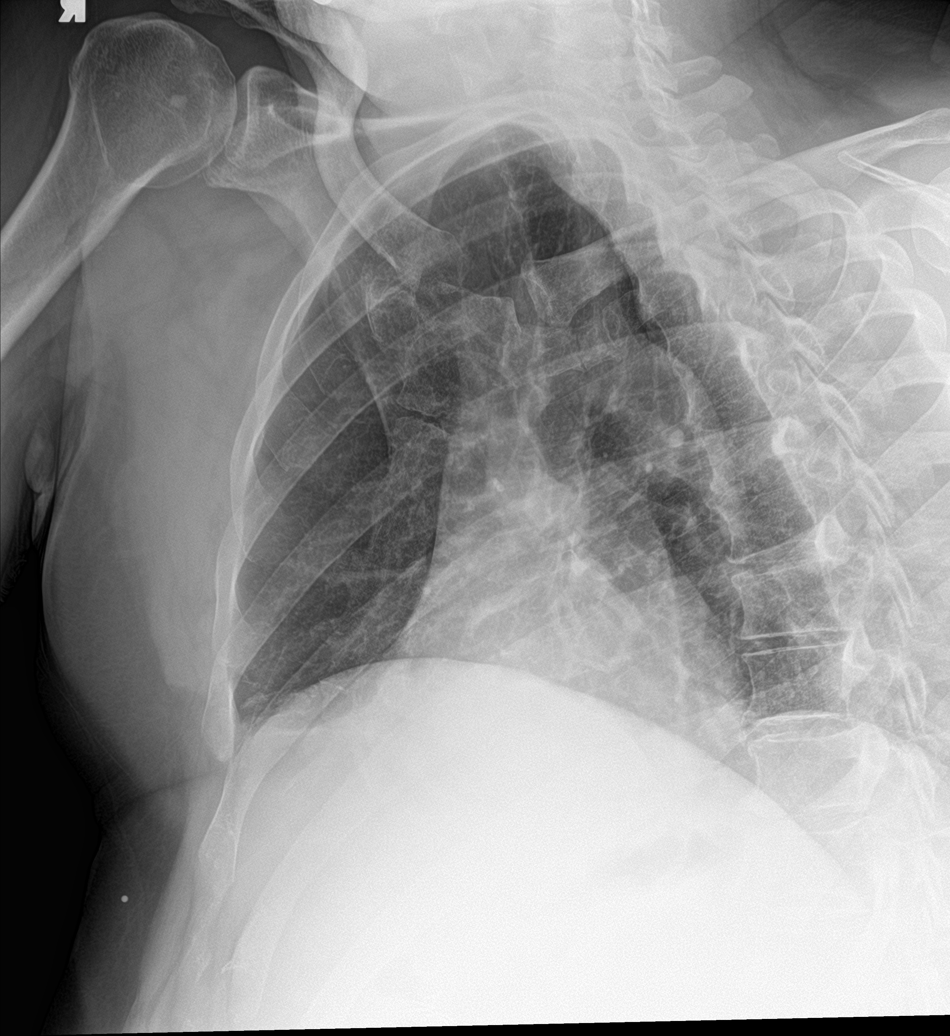

[rib pa obl (2 of 2)]
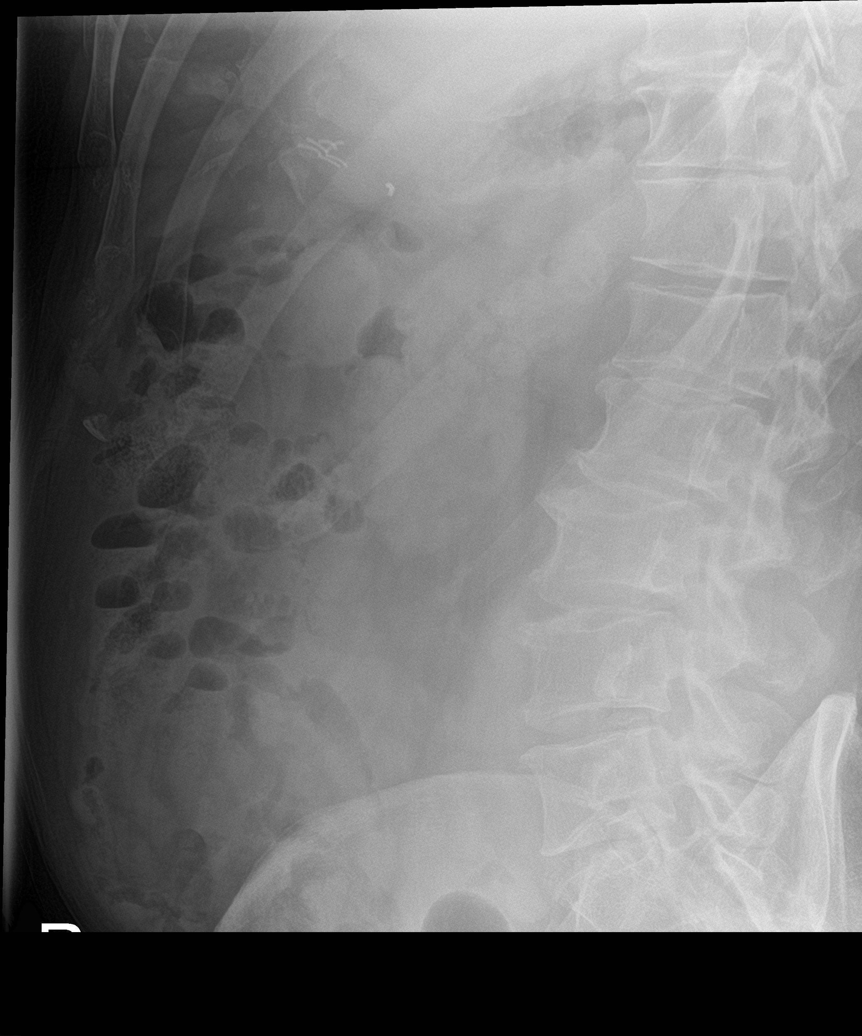

[rib pa (2 of 2)]
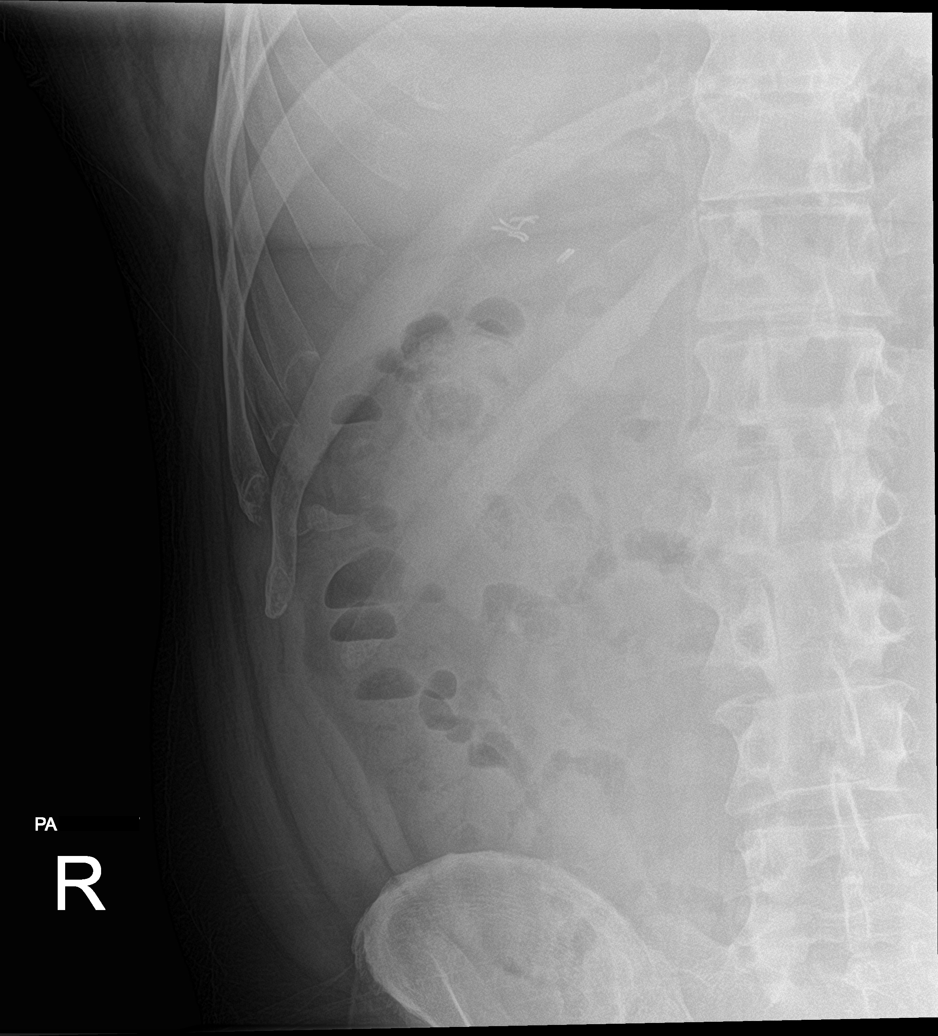

[5 of 5 positions shown; findings below may reference images not displayed]

FINDINGS: The lungs are clear. There is no pleural effusion or pneumothorax.
The cardiac silhouette is within normal limits. No acute osseous
pathology. No displaced rib or scapular fracture.
IMPRESSION: Negative.

## 2020-11-10 IMAGING — DX RIGHT SHOULDER - 2+ VIEW
3 series · 3 of 3 positions shown · non-contrast
Comparison: None.

CLINICAL DATA: Pain status post fall

EXAM:
RIGHT SHOULDER - 2+ VIEW

[shoulder grashey]
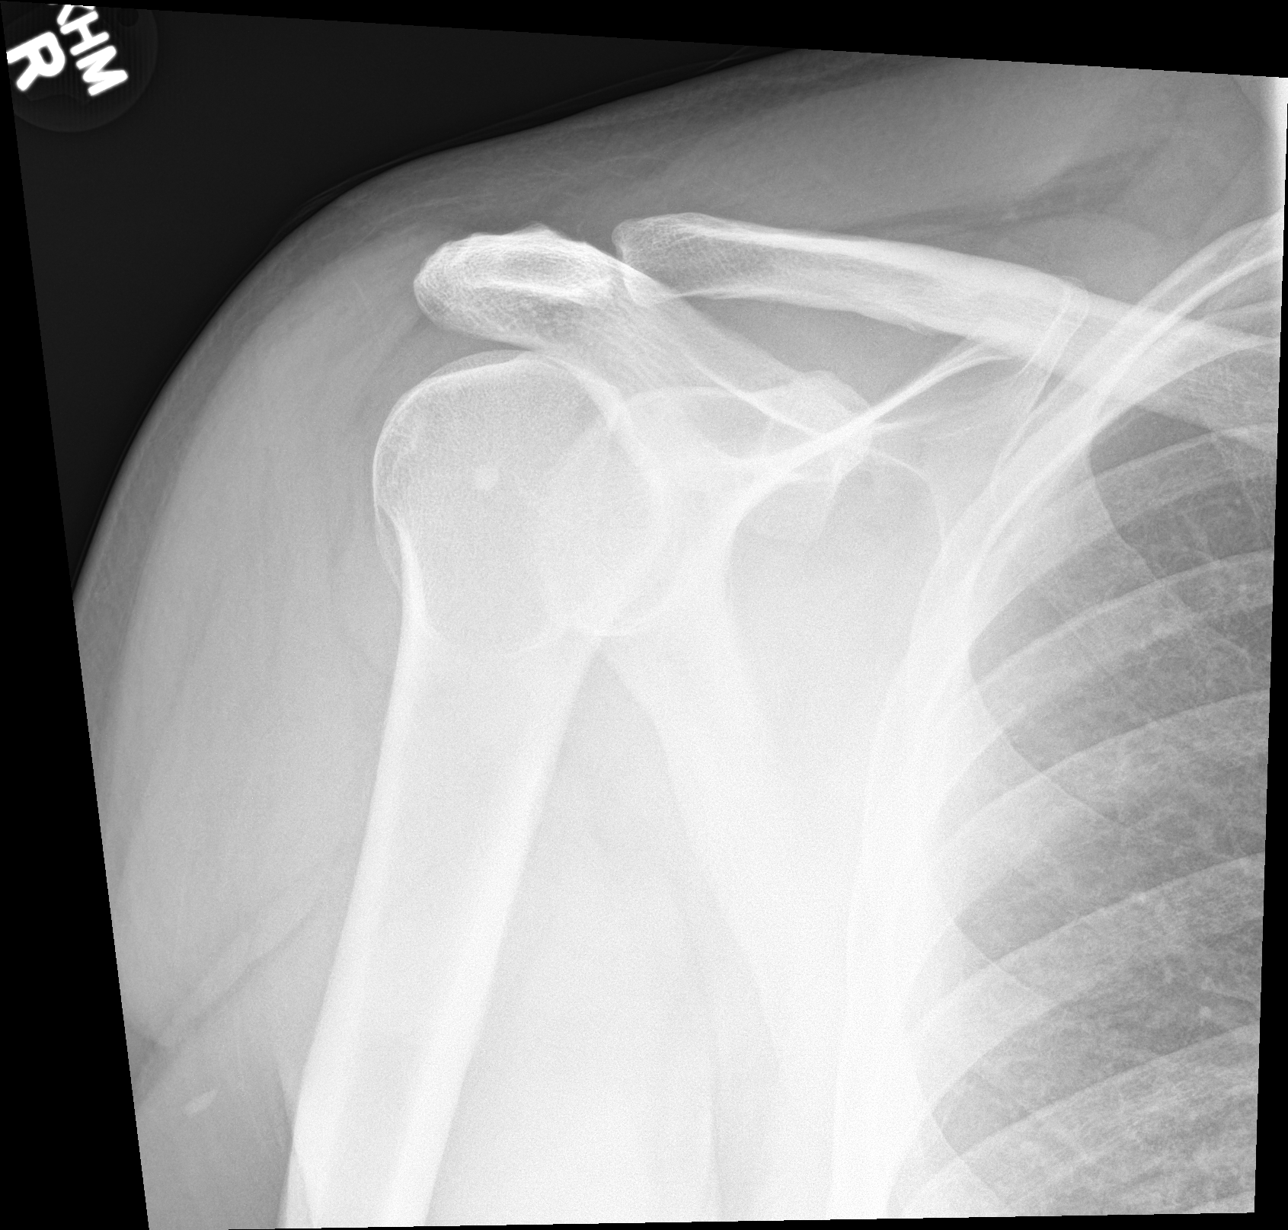

[shoulder y view]
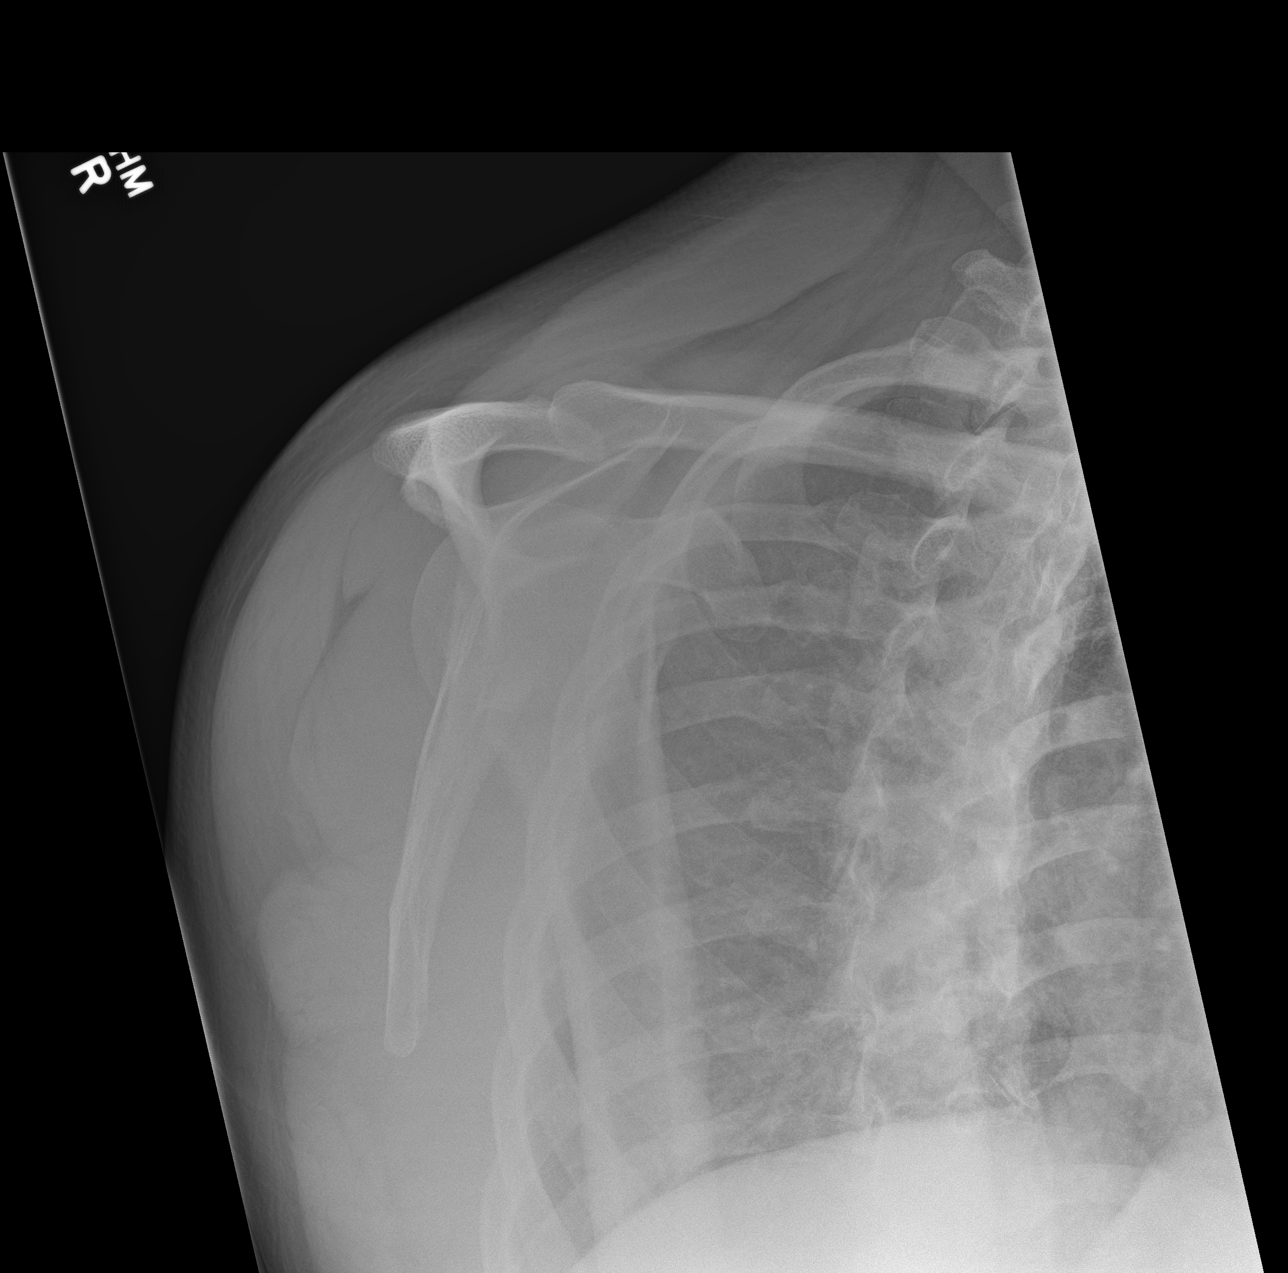

[shoulder ap]
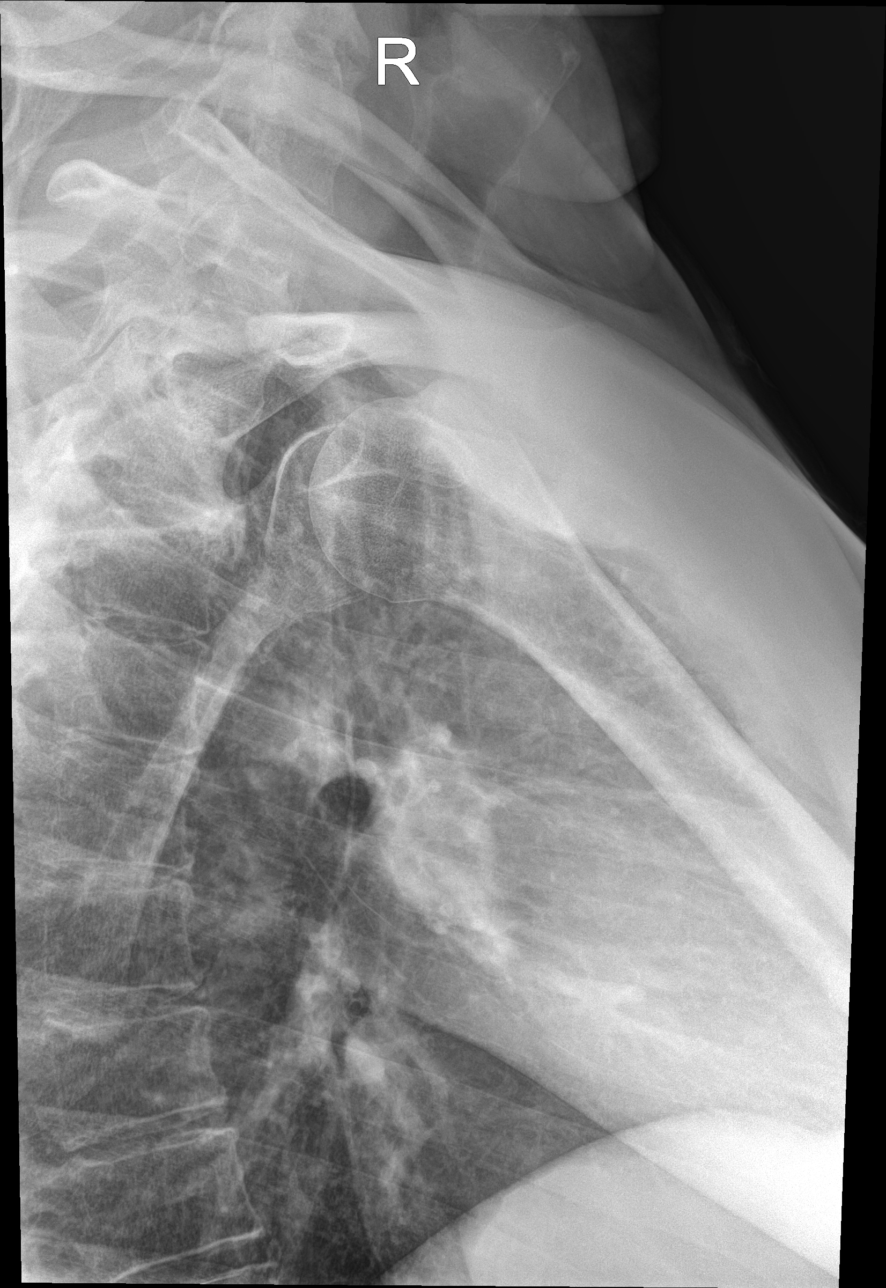

[3 of 3 positions shown; findings below may reference images not displayed]

FINDINGS: There is no acute displaced fracture or dislocation. There may be
some mild anterior subluxation of the humeral head, however this
appearance may be in part projectional.
IMPRESSION: No acute displaced fracture or dislocation.
